# Patient Record
Sex: Female | Born: 1965 | Race: White | Hispanic: No | Marital: Single | Smoking: Former smoker
Health system: Southern US, Community
[De-identification: ages and names within clinical notes are randomized; demographics above are authoritative.]

## PROBLEM LIST (undated history)

## (undated) DIAGNOSIS — I2699 Other pulmonary embolism without acute cor pulmonale: Secondary | ICD-10-CM

## (undated) DIAGNOSIS — E785 Hyperlipidemia, unspecified: Secondary | ICD-10-CM

## (undated) DIAGNOSIS — Z8742 Personal history of other diseases of the female genital tract: Secondary | ICD-10-CM

## (undated) HISTORY — PX: TONSILLECTOMY: SUR1361

## (undated) HISTORY — DX: Personal history of other diseases of the female genital tract: Z87.42

## (undated) HISTORY — PX: NEUROMA SURGERY: SHX722

## (undated) HISTORY — DX: Hyperlipidemia, unspecified: E78.5

## (undated) HISTORY — DX: Other pulmonary embolism without acute cor pulmonale: I26.99

---

## 1993-02-23 DIAGNOSIS — Z8742 Personal history of other diseases of the female genital tract: Secondary | ICD-10-CM

## 1993-02-23 HISTORY — PX: CERVICAL BIOPSY  W/ LOOP ELECTRODE EXCISION: SUR135

## 1993-02-23 HISTORY — DX: Personal history of other diseases of the female genital tract: Z87.42

## 1993-02-23 HISTORY — PX: COLPOSCOPY: SHX161

## 2000-07-21 ENCOUNTER — Other Ambulatory Visit: Admission: RE | Admit: 2000-07-21 | Discharge: 2000-07-21 | Payer: Self-pay | Admitting: Obstetrics and Gynecology

## 2001-08-29 ENCOUNTER — Other Ambulatory Visit: Admission: RE | Admit: 2001-08-29 | Discharge: 2001-08-29 | Payer: Self-pay | Admitting: Obstetrics and Gynecology

## 2002-09-04 ENCOUNTER — Other Ambulatory Visit: Admission: RE | Admit: 2002-09-04 | Discharge: 2002-09-04 | Payer: Self-pay | Admitting: Obstetrics and Gynecology

## 2003-09-18 ENCOUNTER — Other Ambulatory Visit: Admission: RE | Admit: 2003-09-18 | Discharge: 2003-09-18 | Payer: Self-pay | Admitting: *Deleted

## 2005-01-20 ENCOUNTER — Other Ambulatory Visit: Admission: RE | Admit: 2005-01-20 | Discharge: 2005-01-20 | Payer: Self-pay | Admitting: Obstetrics and Gynecology

## 2005-07-12 ENCOUNTER — Inpatient Hospital Stay (HOSPITAL_COMMUNITY): Admission: EM | Admit: 2005-07-12 | Discharge: 2005-07-15 | Payer: Self-pay | Admitting: Emergency Medicine

## 2005-07-19 ENCOUNTER — Emergency Department (HOSPITAL_COMMUNITY): Admission: EM | Admit: 2005-07-19 | Discharge: 2005-07-19 | Payer: Self-pay | Admitting: Emergency Medicine

## 2005-07-22 ENCOUNTER — Ambulatory Visit: Payer: Self-pay | Admitting: Hematology & Oncology

## 2005-08-25 LAB — HYPERCOAGULABLE PANEL, COMPREHENSIVE
AntiThromb III Func: 100 % (ref 75–120)
Anticardiolipin IgA: <7 [APL'U] (ref <13)
Anticardiolipin IgG: <7 [GPL'U] (ref <11)
Anticardiolipin IgM: <7 [MPL'U] (ref <10)
Beta-2-Glycoprotein I IgA: <4 U/mL (ref <10)
Beta-2-Glycoprotein I IgM: <4 U/mL (ref <10)
DRVVT: 45.2 secs — ABNORMAL HIGH (ref 26.75–42.95)
PTT Lupus Anticoagulant: 39.9 secs (ref 30.5–43.1)
Protein S Activity: 48 % — ABNORMAL LOW (ref 81–180)

## 2005-09-23 ENCOUNTER — Encounter: Admission: RE | Admit: 2005-09-23 | Discharge: 2005-09-23 | Payer: Self-pay | Admitting: Family Medicine

## 2005-10-29 ENCOUNTER — Encounter: Admission: RE | Admit: 2005-10-29 | Discharge: 2005-10-29 | Payer: Self-pay | Admitting: Family Medicine

## 2005-12-08 ENCOUNTER — Encounter: Admission: RE | Admit: 2005-12-08 | Discharge: 2005-12-08 | Payer: Self-pay | Admitting: Obstetrics and Gynecology

## 2006-07-06 ENCOUNTER — Encounter: Admission: RE | Admit: 2006-07-06 | Discharge: 2006-07-06 | Payer: Self-pay | Admitting: Family Medicine

## 2006-10-23 IMAGING — CT CT CHEST LIMITED W/O CM
1 of 2 series · 14 of 29 positions shown, 18 images · non-contrast
Comparison: none

CLINICAL DATA: recent pulmonary emboli. Left lower lobe nodule and airspace
opacity.

[Series 2: — · axial · 0.70mm/px · z∈[-260,-185]mm · 14 of 36 slices shown, 18 images]
[im 3/36  mediastinal]
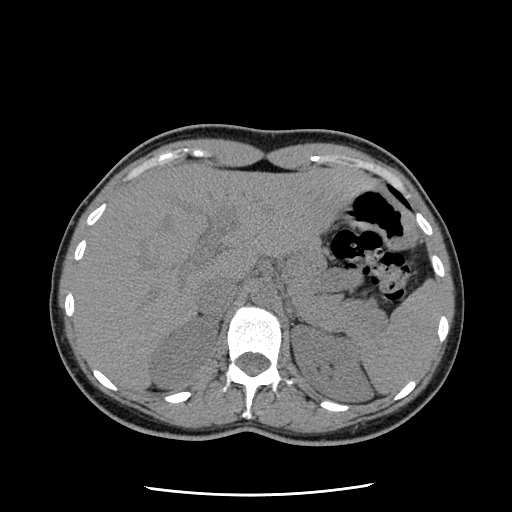
[im 3/36  lung]
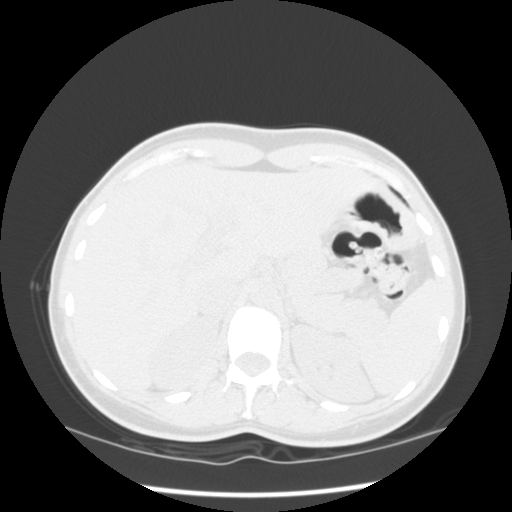
[im 6/36  lung]
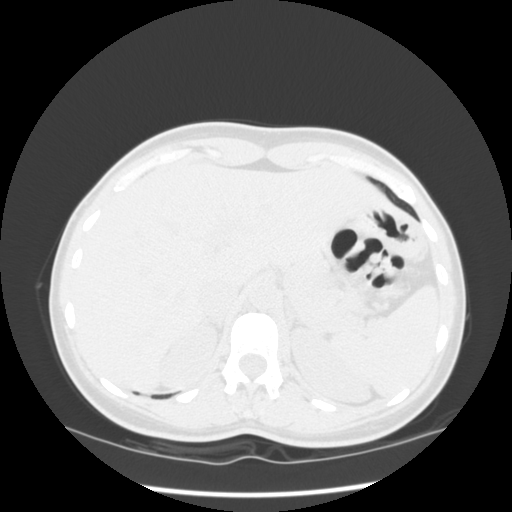
[im 8/36  lung]
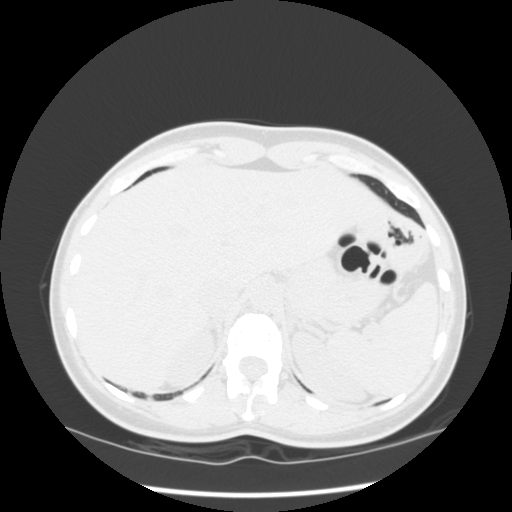
[im 11/36  lung]
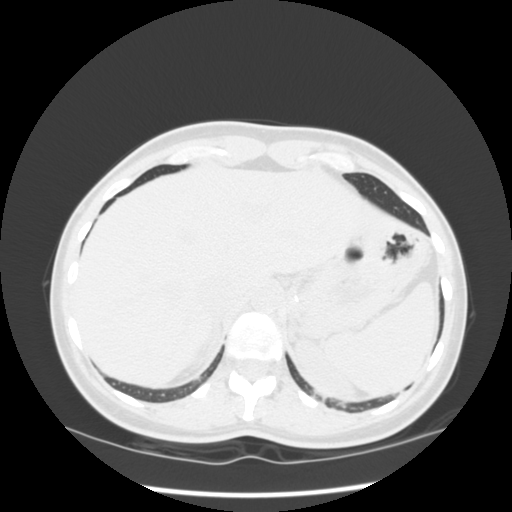
[im 13/36  mediastinal]
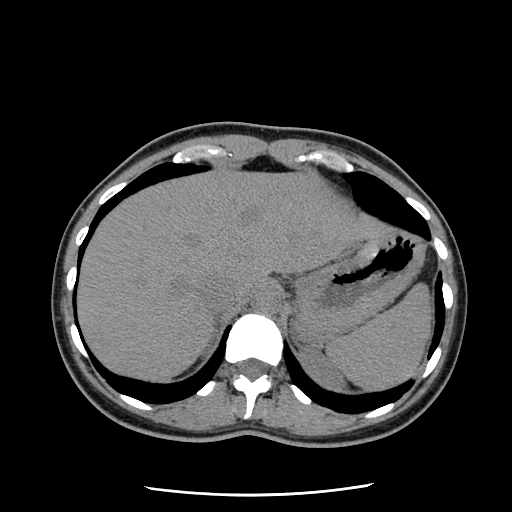
[im 13/36  lung]
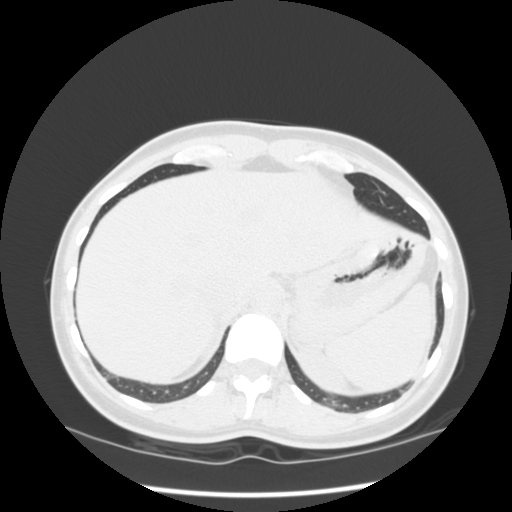
[im 15/36  lung]
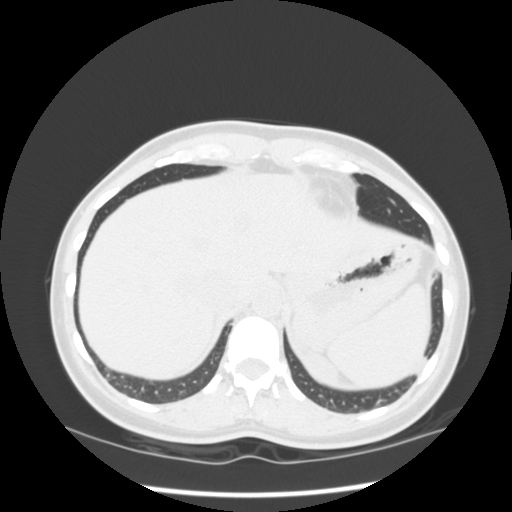
[im 16/36  lung]
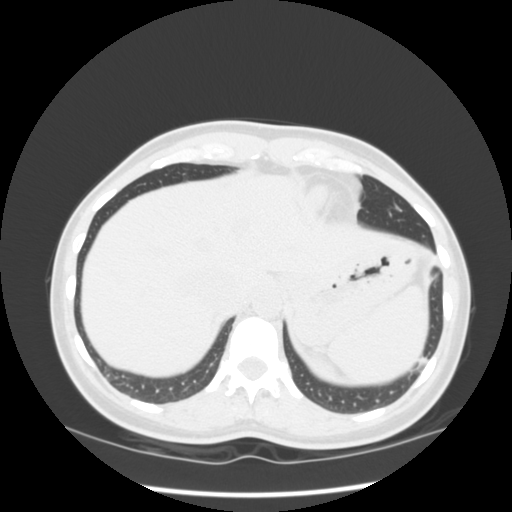
[im 18/36  lung]
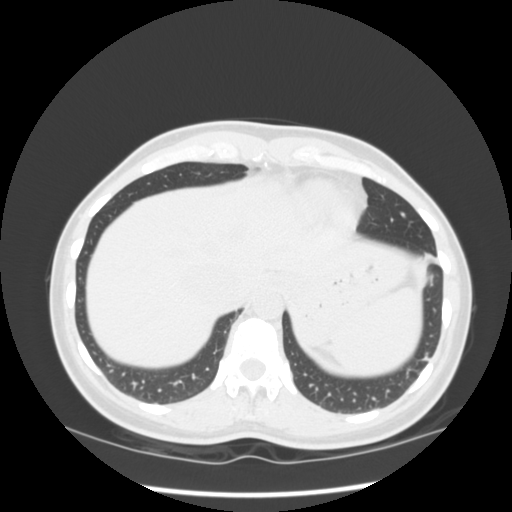
[im 21/36  mediastinal]
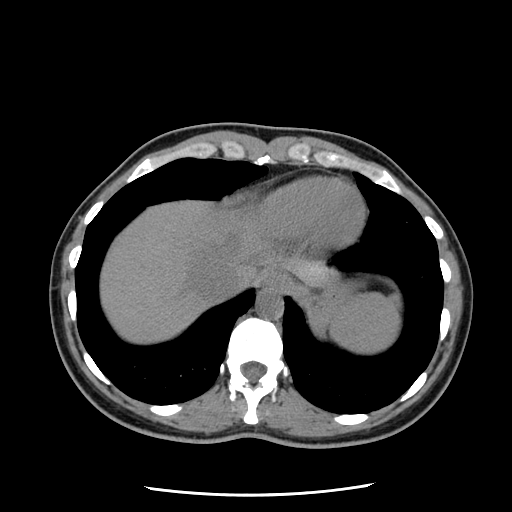
[im 21/36  lung]
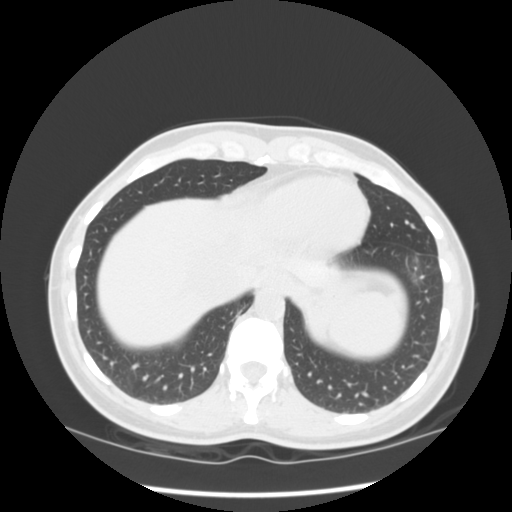
[im 23/36  lung]
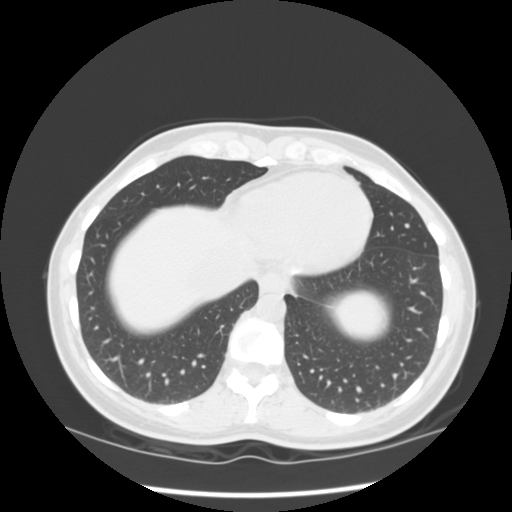
[im 26/36  lung]
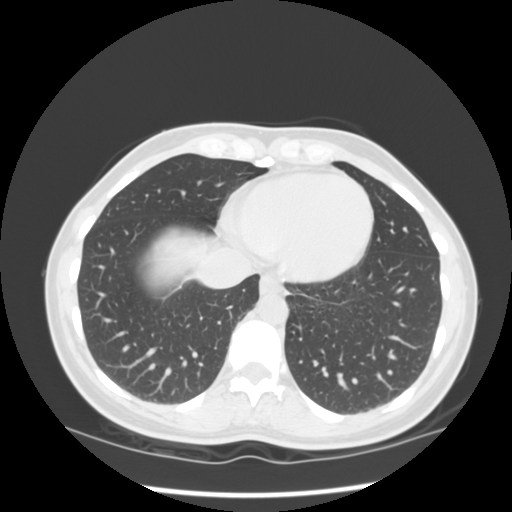
[im 28/36  lung]
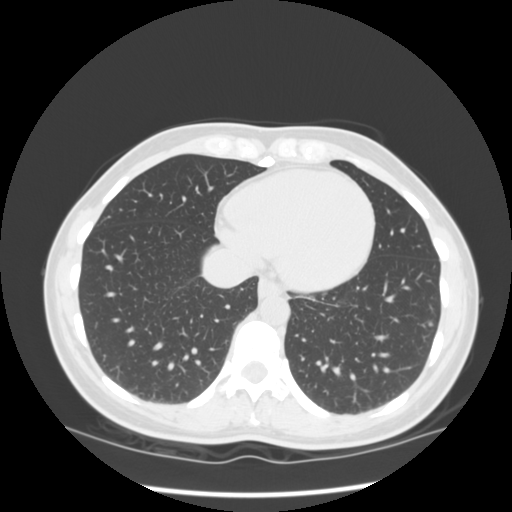
[im 31/36  mediastinal]
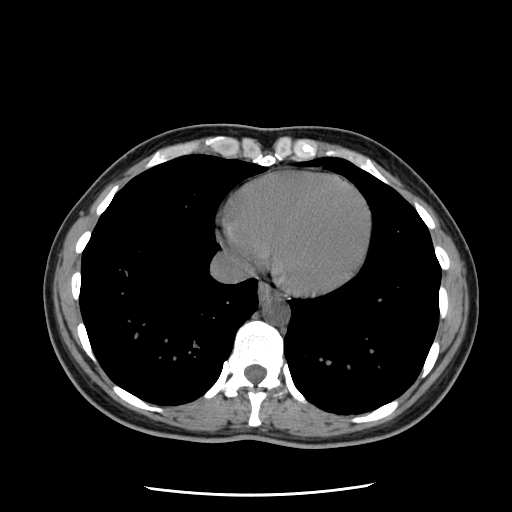
[im 31/36  lung]
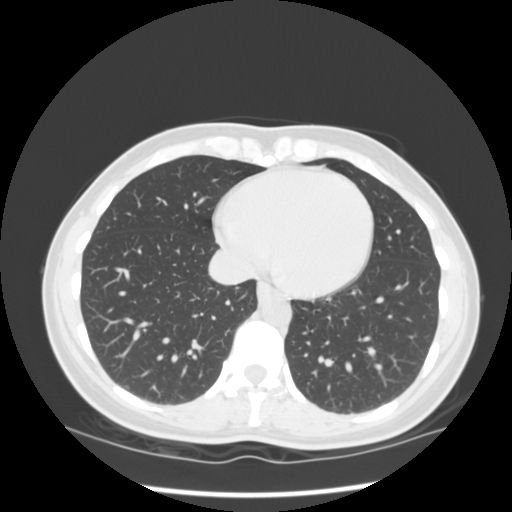
[im 33/36  lung]
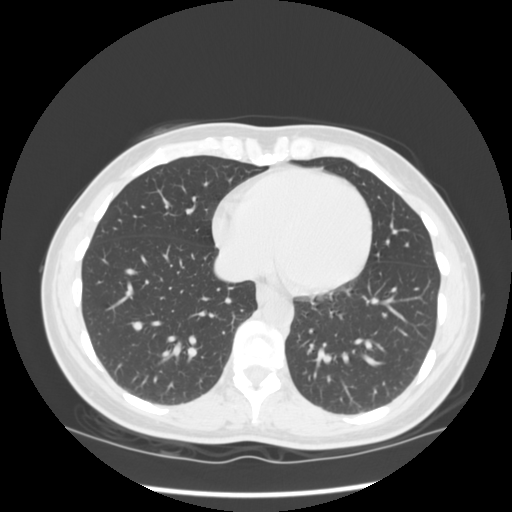

[14 of 29 positions shown; findings below may reference images not displayed]

CT chest limited without contrast:

3.6 mm subpleural nodule in the lateral basal segment left lower lobe, image 9,
decreased in size since previous exam. Minimal residual linear atelectasis or
scarring in the anterior basal segment left lower lobe, site of previous focal
parenchymal opacity. No new nodule or infiltrate. Small left pleural effusion
has resolved.
IMPRESSION: 1. Decrease in size of subpleural left lower lobe nodule and focal anterior left
lower lobe airspace opacity, suggesting non- neoplastic infectious or
inflammatory etiology.

## 2007-08-19 ENCOUNTER — Emergency Department (HOSPITAL_COMMUNITY): Admission: EM | Admit: 2007-08-19 | Discharge: 2007-08-19 | Payer: Self-pay | Admitting: Emergency Medicine

## 2007-10-21 ENCOUNTER — Emergency Department (HOSPITAL_COMMUNITY): Admission: EM | Admit: 2007-10-21 | Discharge: 2007-10-21 | Payer: Self-pay | Admitting: Family Medicine

## 2010-07-11 NOTE — H&P (Signed)
Hailey Howell, Hailey Howell             ACCOUNT NO.:  [id number]   MEDICAL RECORD NO.:  [id number]          PATIENT TYPE:  INP   LOCATION:  4705                         FACILITY:  [facility]   PHYSICIAN:  Michelene Gardener, MD    DATE OF BIRTH:  [birth year]   DATE OF ADMISSION:  07/11/2005  DATE OF DISCHARGE:                                HISTORY & PHYSICAL   PRIMARY CARE PHYSICIAN:  The patient does not have any doctor.  She is  admitted as unassigned.   CHIEF COMPLAINT:  Left-sided chest pain for the last three days.   HISTORY OF PRESENT ILLNESS:  The patient is a 45 year old Caucasian female  with a past medical history of PE in 1999, presented with the above  mentioned complaint.  The patient has a previous history of a PE that was  treated with Coumadin for a few months and was discontinued back in 1999.  Since that time, she has been doing fine, and she is not seeing any doctor  at the present time and not taking any medications.  For the last three  days, she started having left-sided chest pain that is described as sharp,  was increasing in severity, started mild, and today it was up to 10/10.  It  is pleuritic in nature and she got more pain with respirations increasing  without coughing.  There is no radiation of the pain.  It is associated with  mild shortness of breath.  Currently, the pain has gone away because of the  pain medications in the emergency room.   PAST MEDICAL HISTORY:  History of PE in 1999, treated with heparin and then  Coumadin for a few months.  Workup remained at that time per the patient.   MEDICATIONS:  None.   PAST SURGICAL HISTORY:  Denied.   ALLERGIES:  No known drug allergies.   SOCIAL HISTORY:  The patient quit in August 2006, before that she was a  social smoker and only smoked three to four cigarettes a day for a few  years.  She drinks alcohol occasionally.  She denies recreational drugs.   FAMILY HISTORY:  The patient stated that she does not  know too much about  her family as she was not quite sure about their history, so she does not  know if they have clots or not.   REVIEW OF SYSTEMS:  Positive for left-sided chest pain and mild shortness of  breath.  CONSTITUTIONAL:  There is no fever, no weakness, no weight changes.  EYES:  No blurred vision, no pain, no redness.  ENT:  No ear pain, no  hearing loss, no epistaxis, no difficulty swallowing.  RESPIRATORY:  No  cough, no wheezes, no hemoptysis.  CARDIOVASCULAR:  Positive for chest pain,  shortness of breath.  There is no orthopnea and no edema, no palpitations,  no syncope.  GASTROINTESTINAL:  No nausea, no vomiting, no diarrhea, no  abdominal pain, no constipation.  GENITOURINARY:  No dysuria, hematuria, or  frequency.  ENDOCRINE:  No polyuria, no nocturia, no sweating.  HEMATOLOGIC:  No bruises, no bleeding.  INFECTIOUS DISEASE:  No rash, no lesions.  NEUROLOGIC:  No numbness, no tingling, no tremors, no headache, no seizures.  The rest of the systems were reviewed and they were negative.   PHYSICAL EXAMINATION:  VITAL SIGNS:  Temperature is 98.3, blood pressure is  110/69, pulse 82, respiratory rate 20.  GENERAL:  This is a middle-aged Caucasian female lying in bed in no acute  distress.  HEENT:  Conjunctivae are normal.  There is no erythema.  Pupils equal,  round, reactive to light and accommodation.  There is no ptosis.  Hearing is  intact.  There is no ear discharge or infection.  There is nose discharge or  bleeding.  Oral mucosa is moist.  There is no pharyngealerythema.  NECK:  Supple, no JVD, no carotid bruit. No lymphadenopathy.  No thyroid  enlargement or thyroid tenderness.  CARDIOVASCULAR:  S1 and S2 were heard.  There are no additional heart  sounds.  There is no murmurs, rubs, or gallops.  RESPIRATORY:  The patient is breathing between 16 and 18.  There is no use  of accessory muscles or intracostal retractions.  No wheezes, rhonchi, or  rales.   ABDOMEN:  Soft, not distended, no tenderness, no hepatosplenomegaly.  Bowel  sounds are normal.  Umbilicus is central.  EXTREMITIES:  Show no edema, no rash, and no varicose veins.  SKIN:  No rash, no erythema.  Skin is warm to touch.  NEUROLOGIC:  Cranial nerves intact from II to XII.  Strength is 5/5 in all  four extremities. Reflexes are 2/2 in all major reflexes.  Sensation is  normal to pain and touch sensation.  PSYCHIATRIC:  The patient is alert and oriented x3.  There is no recent or  remote memory impairment.  The patient is cooperative to physical  examination.   LABORATORY DATA:  Sodium 138, potassium 3.8, chloride 106, BUN of 6,  creatinine is 0.6, glucose 82.  pH 7.35, PCO2 of 42, bicarbonate of 23.3.  WBC 10.1, hemoglobin 14, hematocrit 41.2, MCV 97, platelet count is 177.  EKG is normal sinus rhythm.  There are small Q-waves less than 5 mm in the  inferior leads, lead II, III, and aVF.  No evidence of acute ischemia.  No  ST segment abnormalities and no T-wave abnormalities.  Chest CT is positive  for PE, as per the ER attending.   IMPRESSION:  This is a 45 year old female with a past medical history of  pulmonary embolism who presented with chest pain and her CT scan of the  chest is positive for pulmonary embolism.   ASSESSMENT AND PLAN:  Pulmonary embolism.  As mentioned, this patient has a  previous history of pulmonary embolism and that was in 1999, and was treated  with heparin and Coumadin for a few months after that.  Since that time, she  has been doing fine with no symptoms and was not following with doctors.  Her workup for pulmonary embolism was negative at that time, and that was  attributed to her oral contraceptives which she discontinued.  At the  present time, she denies any oral contraceptive dose.  There seem to be no  other risk factors.  The patient did not have a prolonged stay.  There is no history of recent travel and no recent immobilization.   She denied smoking  which she quit in August 2006.  I will get ledein V levels, antithrombin  levels, protein C, and protein S levels.  I will also start her on  intravenous heparin, and the dose will be adjusted by the pharmacy.  We will  start her on Coumadin 5 mg once daily and the target INR will be 2 to 3  daily, and we will follow her INR on a daily basis.   TOTAL ASSESSMENT TIME:  45 minutes.      Michelene Gardener, MD  Electronically Signed     NAE/MEDQ  D:  07/12/2005  T:  07/12/2005  Job:  [id number]

## 2010-07-11 NOTE — Discharge Summary (Signed)
Hailey Howell, Hailey Howell             ACCOUNT NO.:  [id number]   MEDICAL RECORD NO.:  [id number]          PATIENT TYPE:  INP   LOCATION:  4702                         FACILITY:  [facility]   PHYSICIAN:  Hollice Espy, M.D.DATE OF BIRTH:  [birth year]   DATE OF ADMISSION:  07/11/2005  DATE OF DISCHARGE:  07/15/2005                                 DISCHARGE SUMMARY   PRIMARY CARE PHYSICIAN:  The patient has no primary care physician, but will  be establishing with the [facility] physicians on [facility].   CHIEF COMPLAINT:  Shortness of breath.   HISTORY OF PRESENT ILLNESS:  The patient is a 45 year old white female with  past medical history of recurrent pulmonary embolus who presented to the  emergency room on Jul 12, 2005, complaining of chest pain, shortness of  breath.  She last had a pulmonary embolus in 1999 which was attributed to  birth control pills.  She was not on them.  The patient was admitted,  started on IV heparin and Coumadin.  Coagulation studies were ordered,  although it is unclear if these labs were drawn before or after the patient  received heparin and Coumadin.  The patient had a CT scan done which showed  specifically where her pulmonary embolus was located, showed acute bilateral  pulmonary emboli.  A left diffusion atelectasis and probable infarct was  noted in the left lower lobe.  The patient was admitted and started on  heparin.  Over the next several days she was feeling better.  She initially  started on Coumadin 10 mg on May 21.  By May 22 her INR had increased to  1.6.  Given her sensitivity to Coumadin I cut her dose down to 5 mg and she  increased to 1.8 on May 23.  At this point she was feeling better and the  plan is going to be to discharge her home after her Coumadin level has  reached a therapeutic dose.  Will give her 5 mg of Coumadin prior to  discharge and then continue her at 4 mg q.h.s.  She will have an INR checked  on Friday, May 25, two  days after discharge, with the results being called  in to myself and a follow-up PT/INR done in one week.  By that time she  should have established with a PCP.  All of her lab studies including factor  5 Leiden, protein, C&S, are currently unremarkable.  The rest of the  patient's lab work is unremarkable as well.   DISCHARGE DIAGNOSES:  1.  Acute bilateral pulmonary emboli in the setting of a patient with a      previous pulmonary embolus of unknown etiology.  The patient may have an      unknown genetic deficiency, although at this time we would recommend      indefinite Coumadin until this can be further resolved.  2.  The patient was noted to also have a lung nodule described on her CT of      the chest as a 5.6 mm lung nodule in the left lower lobe.  The  patient      did previously smoke cigarettes for a few years, only 3-4 cigarettes a      day and was a social smoker.  I would recommend a 81-month follow-up CT      scan.   DISCHARGE MEDICATIONS:  1.  Coumadin 4 mg p.o. q.h.s.  2.  Percocet 10/225 p.o. q.6 h. p.r.n., total number 20.     Hollice Espy, M.D.  Electronically Signed    SKK/MEDQ  D:  07/15/2005  T:  07/15/2005  Job:  [id number]

## 2010-11-20 LAB — PROTIME-INR
INR: 0.9
Prothrombin Time: 12.8

## 2010-11-20 LAB — D-DIMER, QUANTITATIVE: D-Dimer, Quant: 0.38

## 2011-03-21 ENCOUNTER — Ambulatory Visit (INDEPENDENT_AMBULATORY_CARE_PROVIDER_SITE_OTHER): Payer: Self-pay

## 2011-03-21 DIAGNOSIS — F988 Other specified behavioral and emotional disorders with onset usually occurring in childhood and adolescence: Secondary | ICD-10-CM

## 2011-03-21 DIAGNOSIS — L0100 Impetigo, unspecified: Secondary | ICD-10-CM

## 2011-05-02 ENCOUNTER — Ambulatory Visit: Payer: Self-pay | Admitting: Family Medicine

## 2011-05-02 DIAGNOSIS — M25519 Pain in unspecified shoulder: Secondary | ICD-10-CM

## 2011-05-02 DIAGNOSIS — L738 Other specified follicular disorders: Secondary | ICD-10-CM

## 2011-05-02 DIAGNOSIS — L739 Follicular disorder, unspecified: Secondary | ICD-10-CM

## 2011-05-02 MED ORDER — CYCLOBENZAPRINE HCL 5 MG PO TABS: 5.0000 mg | ORAL_TABLET | Freq: Every evening | ORAL | Status: AC

## 2011-05-02 MED ORDER — CEPHALEXIN 500 MG PO CAPS: 500.0000 mg | ORAL_CAPSULE | Freq: Four times a day (QID) | ORAL | Status: AC

## 2011-05-02 NOTE — Patient Instructions (Addendum)
Take the antibiotic as directed. If rash and itching persists after treatment consider treating for scabies.  Muscle relaxant for left shoulder pain. Take one at bedtime. If his shoulder keeps bothering you, then you might need physical therapy.

## 2011-05-02 NOTE — Progress Notes (Signed)
Subjective: Patient is here for a couple of things today. First of all she has a rash again. When she was here in January Dr. L. treated her with Keflex and Bactroban for a rash on her chest and arms which had some Cipro appearance to it. She did not get the Bactroban, but did take the Keflex. It took the course of antibiotics to get her cleared up. She's apparently had outbreaks of this periodically. The area is itching to it  She also has pain in her left shoulder, primarily in the upper back region. There is an area of point tenderness and pain that gets some relief with massage light palpation.  She is on chronic anticoagulant therapy for a history of several episodes of pulmonary emboli, so her medication options are somewhat limited.  Objective: Clusters of folliculitis like rash with largest area about 10 or 12 cm in diameter on her chest wall and epigastric area. Has a small patch on the left upper chest wall and a small patch on her left shoulder. Very follicular appearance to this. The abdomen especially is quite erythematous.  Neck has good range of motion, arm range of motion today is adequate, and she is quite tender above the upper aspect of the left scapula on the medial side.  Assessment: 1 folliculitis (with the recurrences and with the itching need to keep scabies in mind)  2 left shoulder and back strain  Plan: Keflex 500 mg 4 times a day for 10 days  Cyclobenzaprine 5 mg at bedtime

## 2012-10-14 ENCOUNTER — Encounter: Payer: Self-pay | Admitting: Nurse Practitioner

## 2012-11-30 ENCOUNTER — Encounter: Payer: Self-pay | Admitting: Nurse Practitioner

## 2013-03-16 ENCOUNTER — Telehealth: Payer: Self-pay | Admitting: Obstetrics and Gynecology

## 2013-03-16 ENCOUNTER — Encounter: Payer: Self-pay | Admitting: Nurse Practitioner

## 2013-03-16 ENCOUNTER — Ambulatory Visit (INDEPENDENT_AMBULATORY_CARE_PROVIDER_SITE_OTHER): Payer: BC Managed Care – PPO | Admitting: Nurse Practitioner

## 2013-03-16 VITALS — BP 110/64 | HR 80 | Ht 67.5 in | Wt 172.0 lb

## 2013-03-16 DIAGNOSIS — Z113 Encounter for screening for infections with a predominantly sexual mode of transmission: Secondary | ICD-10-CM

## 2013-03-16 DIAGNOSIS — F4320 Adjustment disorder, unspecified: Secondary | ICD-10-CM | POA: Insufficient documentation

## 2013-03-16 DIAGNOSIS — I2782 Chronic pulmonary embolism: Secondary | ICD-10-CM

## 2013-03-16 DIAGNOSIS — Z01419 Encounter for gynecological examination (general) (routine) without abnormal findings: Secondary | ICD-10-CM

## 2013-03-16 DIAGNOSIS — F4321 Adjustment disorder with depressed mood: Secondary | ICD-10-CM

## 2013-03-16 DIAGNOSIS — Z0000 Encounter for general adult medical examination without abnormal findings: Secondary | ICD-10-CM

## 2013-03-16 DIAGNOSIS — Z780 Asymptomatic menopausal state: Secondary | ICD-10-CM | POA: Insufficient documentation

## 2013-03-16 DIAGNOSIS — Z1211 Encounter for screening for malignant neoplasm of colon: Secondary | ICD-10-CM

## 2013-03-16 LAB — POCT URINALYSIS DIPSTICK
BILIRUBIN UA: NEGATIVE
Glucose, UA: NEGATIVE
KETONES UA: NEGATIVE
LEUKOCYTES UA: NEGATIVE
Nitrite, UA: NEGATIVE
PH UA: 5
Protein, UA: NEGATIVE
Urobilinogen, UA: NEGATIVE

## 2013-03-16 LAB — STD PANEL
HIV: NONREACTIVE
Hepatitis B Surface Ag: NEGATIVE

## 2013-03-16 MED ORDER — METRONIDAZOLE 0.75 % VA GEL
1.0000 | Freq: Every day | VAGINAL | Status: DC
Start: 2013-03-16 — End: 2013-05-04

## 2013-03-16 NOTE — Patient Instructions (Addendum)

## 2013-03-16 NOTE — Progress Notes (Signed)
Patient ID: Annell GreeningSharon A Barrack, female   DOB: 05/21/1965, 48 y.o.   MRN: [id number] 48 y.o. G2P0020 Divorced Caucasian Fe here to reestablish as a NGYN annual exam. Lapse of GYN care secondary to insurance coverage.  She is now postmenopausal and has some vaso symptoms that are tolerable.   She has ended 3 year relationship about 2 months ago.  Her mother died of stage IV colon cancer in October 2014.  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  Not dating.  Would like to have STD's checked.  She does have a vaginal odor on / off for about a month.  Patient's last menstrual period was 01/24/2012.          Sexually active: yes  The current method of family planning is condoms all of the time.    Exercising: no  Pt is not currently regularly exercising. Smoker:  no  Health Maintenance: Pap:  2007? History of LEEP with high grade in 1995 MMG:  12/11/05, Bi-rads 1: negative TDaP:  ? Labs: HB: PCP Urine: negative   reports that she quit smoking about 10 years ago. Her smoking use included Cigarettes. She has a 7.5 pack-year smoking history. She has never used smokeless tobacco. She reports that she drinks about 4.2 ounces of alcohol per week. She reports that she does not use illicit drugs.  Past Medical History  Diagnosis Date  . PE (pulmonary embolism) 1995 & 2007    on OCP before 1995 event on left, 2007 bilateral on warfarin  . Hyperlipidemia   . History of abnormal cervical Pap smear 1995    Past Surgical History  Procedure Laterality Date  . Cervical biopsy  w/ loop electrode excision  1995  . Colposcopy  1995  . Neuroma surgery Right 1993/ 1994    right foot  . Tonsillectomy  age 89    Current Outpatient Prescriptions  Medication Sig Dispense Refill  . citalopram (CELEXA) 10 MG tablet Take 1 tablet by mouth daily.      Marland Kitchen. warfarin (COUMADIN) 7.5 MG tablet Take 7.5 mg by mouth daily.      . metroNIDAZOLE (METROGEL) 0.75 % vaginal gel Place 1 Applicatorful  vaginally at bedtime.  70 g  0   No current facility-administered medications for this visit.    Family History  Problem Relation Age of Onset  . Hypertension Mother   . Colon cancer Mother   . Hypertension Father   . Heart disease Father   . Evelene CroonWolff Parkinson White syndrome Father   . Hyperlipidemia Father   . Hyperlipidemia Brother   . Mental illness Brother     schizophrenoia and borderline retardation  . Hyperlipidemia Brother     ROS:  Pertinent items are noted in HPI.  Otherwise, a comprehensive ROS was negative.  Exam:   BP 110/64  Pulse 80  Ht 5' 7.5" (1.715 m)  Wt 172 lb (78.019 kg)  BMI 26.53 kg/m2  LMP 01/24/2012 Height: 5' 7.5" (171.5 cm)  Ht Readings from Last 3 Encounters:  03/16/13 5' 7.5" (1.715 m)  05/02/11 4\' 10"  (1.473 m)    General appearance: alert, cooperative and appears stated age Head: Normocephalic, without obvious abnormality, atraumatic Neck: no adenopathy, supple, symmetrical, trachea midline and thyroid normal to inspection and palpation Lungs: clear to auscultation bilaterally Breasts: normal appearance, no masses or tenderness Heart: regular rate and rhythm Abdomen: soft, non-tender; no masses,  no organomegaly Extremities: extremities normal, atraumatic, no cyanosis or edema Skin:  Skin color, texture, turgor normal. No rashes or lesions Lymph nodes: Cervical, supraclavicular, and axillary nodes normal. No abnormal inguinal nodes palpated Neurologic: Grossly normal   Pelvic: External genitalia:  no lesions              Urethra:  normal appearing urethra with no masses, tenderness or lesions              Bartholin's and Skene's: normal                 Vagina: normal appearing vagina with normal color and discharge, no lesions              Cervix: anteverted              Pap taken: yes Bimanual Exam:  Uterus:  normal size, contour, position, consistency, mobility, non-tender              Adnexa: no mass, fullness, tenderness                Rectovaginal: Confirms               Anus:  normal sphincter tone, no lesions  A:  Well Woman with normal exam  History of high grade CIN with LEEP 1995 - normal since  Postmenopausal no HRT to be given  History of PE left after being on OCP 1995, with bilateral PE 2007, now on Coumadin  Lapse of GYN care secondary to insurance problems.  Now followed by new PCP and labs done there  R/O STD's  Vaginal odor on/ off  [facility]: colon cancer stage IV - mother  Grief reaction - mother passed in October 2014  P:   Pap smear as per guidelines Done today  Mammogram due now and will schedule  Metrogel vaginal cream to use HS prn for odor  Will follow with lab and pap results  Also had labs with PCP - she thinks [facility] was done will try to get results.  Referral for screening colonoscopy with Dr. Daryll Drown on breast self exam, mammography screening, adequate intake of calcium and vitamin D, diet and exercise return annually or prn  An After Visit Summary was printed and given to the patient.

## 2013-03-16 NOTE — Telephone Encounter (Signed)
Voicemail confirmed patient name/ left message that patient is scheduled with Dr Loreta AveMann 01.29.2015 @1015 ./ left telephone # for Dr. Loreta AveMann [phone] if patient is unable to make this appointment.//ssf

## 2013-03-17 LAB — IPS N GONORRHOEA AND CHLAMYDIA BY PCR

## 2013-03-17 NOTE — Progress Notes (Signed)
Encounter reviewed by Dr. Monay Houlton Silva.  

## 2013-03-20 LAB — IPS PAP TEST WITH HPV

## 2013-03-21 ENCOUNTER — Telehealth: Payer: Self-pay | Admitting: *Deleted

## 2013-03-21 NOTE — Telephone Encounter (Signed)
Message copied by Luisa DagoPHILLIPS, Amenda Duclos C on Tue Mar 21, 2013  1:29 PM ------      Message from: Ria CommentGRUBB, PATRICIA R      Created: Mon Mar 20, 2013 12:46 PM       Let patient know pap results - since no pap is several years.  She did have BV on pap -but I already gave her RX for Metrogel, have her to use it hs X 5 ------

## 2013-03-21 NOTE — Telephone Encounter (Signed)
I have attempted to contact this patient by phone with the following results: left message to return my call on answering machine (home/mobile).  

## 2013-03-22 NOTE — Telephone Encounter (Signed)
Pt notified of results on 03/21/13 in result note.

## 2013-05-04 ENCOUNTER — Encounter: Payer: Self-pay | Admitting: Nurse Practitioner

## 2013-05-04 ENCOUNTER — Ambulatory Visit (INDEPENDENT_AMBULATORY_CARE_PROVIDER_SITE_OTHER): Payer: BC Managed Care – PPO | Admitting: Nurse Practitioner

## 2013-05-04 VITALS — BP 120/74 | HR 76 | Temp 98.7°F | Ht 67.5 in | Wt 176.0 lb

## 2013-05-04 DIAGNOSIS — B373 Candidiasis of vulva and vagina: Secondary | ICD-10-CM

## 2013-05-04 DIAGNOSIS — B3731 Acute candidiasis of vulva and vagina: Secondary | ICD-10-CM

## 2013-05-04 MED ORDER — NYSTATIN-TRIAMCINOLONE 100000-0.1 UNIT/GM-% EX OINT: 1.0000 "application " | TOPICAL_OINTMENT | Freq: Two times a day (BID) | CUTANEOUS | Status: AC

## 2013-05-04 MED ORDER — FLUCONAZOLE 150 MG PO TABS: 150.0000 mg | ORAL_TABLET | Freq: Once | ORAL | Status: DC

## 2013-05-04 NOTE — Patient Instructions (Signed)
Monilial Vaginitis Vaginitis in a soreness, swelling and redness (inflammation) of the vagina and vulva. Monilial vaginitis is not a sexually transmitted infection. CAUSES  Yeast vaginitis is caused by yeast (candida) that is normally found in your vagina. With a yeast infection, the candida has overgrown in number to a point that upsets the chemical balance. SYMPTOMS   White, thick vaginal discharge.  Swelling, itching, redness and irritation of the vagina and possibly the lips of the vagina (vulva).  Burning or painful urination.  Painful intercourse. DIAGNOSIS  Things that may contribute to monilial vaginitis are:  Postmenopausal and virginal states.  Pregnancy.  Infections.  Being tired, sick or stressed, especially if you had monilial vaginitis in the past.  Diabetes. Good control will help lower the chance.  Birth control pills.  Tight fitting garments.  Using bubble bath, feminine sprays, douches or deodorant tampons.  Taking certain medications that kill germs (antibiotics).  Sporadic recurrence can occur if you become ill. TREATMENT  Your caregiver will give you medication.  There are several kinds of anti monilial vaginal creams and suppositories specific for monilial vaginitis. For recurrent yeast infections, use a suppository or cream in the vagina 2 times a week, or as directed.  Anti-monilial or steroid cream for the itching or irritation of the vulva may also be used. Get your caregiver's permission.  Painting the vagina with methylene blue solution may help if the monilial cream does not work.  Eating yogurt may help prevent monilial vaginitis. HOME CARE INSTRUCTIONS   Finish all medication as prescribed.  Do not have sex until treatment is completed or after your caregiver tells you it is okay.  Take warm sitz baths.  Do not douche.  Do not use tampons, especially scented ones.  Wear cotton underwear.  Avoid tight pants and panty  hose.  Tell your sexual partner that you have a yeast infection. They should go to their caregiver if they have symptoms such as mild rash or itching.  Your sexual partner should be treated as well if your infection is difficult to eliminate.  Practice safer sex. Use condoms.  Some vaginal medications cause latex condoms to fail. Vaginal medications that harm condoms are:  Cleocin cream.  Butoconazole (Femstat).  Terconazole (Terazol) vaginal suppository.  Miconazole (Monistat) (may be purchased over the counter). SEEK MEDICAL CARE IF:   You have a temperature by mouth above 102 F (38.9 C).  The infection is getting worse after 2 days of treatment.  The infection is not getting better after 3 days of treatment.  You develop blisters in or around your vagina.  You develop vaginal bleeding, and it is not your menstrual period.  You have pain when you urinate.  You develop intestinal problems.  You have pain with sexual intercourse. Document Released: 11/19/2004 Document Revised: 05/04/2011 Document Reviewed: 08/03/2008 [facility] Patient Information 2014 [place], [place].    Diflucan 150 today and repeat in 3 days and then repeat in 1 week and then next week.

## 2013-05-04 NOTE — Progress Notes (Signed)
Subjective:     Patient ID: Hailey Howell, female   DOB: 06/22/1965, 48 y.o.   MRN: [id number]  HPI  This 48 yo G2P0 DW Fe presents with a ongoing external yeast symptoms for several weeks to a month.  She has been very careful about only using her regular scent free soaps.  She did complete Metrogel for vaginal odor given at her [facility] in January. Those symptoms of odor are gone.  She denies any urinary symptoms. Not dating.  She also reports that the Celexa given by her PCP after her mothers death 10/14 did not work well for her and almost felt worse with med's.  Now off Celexa.   Review of Systems  Constitutional: Negative for fever, chills and fatigue.  Respiratory: Negative.   Gastrointestinal: Negative.   Genitourinary: Positive for vaginal discharge. Negative for dysuria, urgency, frequency, flank pain, vaginal bleeding, genital sores and pelvic pain.  Musculoskeletal: Negative.   Skin: Positive for color change and rash.  Neurological: Negative.   Psychiatric/Behavioral: Negative.        Objective:   Physical Exam  Constitutional: She appears well-developed and well-nourished. No distress.  Abdominal: Soft. She exhibits no distension. There is no tenderness.  Genitourinary:     No vaginal discharge. There is a wide spread red rash  around the labia going down to the perianal area with several areas of linear cuts and scratch marks.       Assessment:     Yeast Vulva vaginitis Recent use of vaginal antibiotics that may have flared symptoms    Plan:     Diflucan 150 mg today repeat in 3 days, then repeat weekly CX 2 more weeks Triamcinolone cream and Nystatin to apply BID to vulvar and perianal areas

## 2013-05-09 NOTE — Progress Notes (Signed)
Encounter reviewed by Dr. Jahnaya Branscome Silva.  

## 2013-07-26 ENCOUNTER — Encounter: Payer: Self-pay | Admitting: Nurse Practitioner

## 2013-07-27 ENCOUNTER — Other Ambulatory Visit: Payer: Self-pay | Admitting: Nurse Practitioner

## 2013-07-27 MED ORDER — FLUCONAZOLE 150 MG PO TABS: 150.0000 mg | ORAL_TABLET | Freq: Once | ORAL | Status: AC

## 2013-12-25 ENCOUNTER — Encounter: Payer: Self-pay | Admitting: Nurse Practitioner

## 2014-03-19 ENCOUNTER — Ambulatory Visit: Payer: BC Managed Care – PPO | Admitting: Nurse Practitioner

## 2014-03-19 ENCOUNTER — Telehealth: Payer: Self-pay | Admitting: Nurse Practitioner

## 2014-03-19 NOTE — Telephone Encounter (Signed)
UNABLE TO LEAVE VOICEMAIL IT IS NOT SET UP/RDpatient is in recall °
